# Patient Record
Sex: Male | Born: 1995 | Race: Black or African American | Hispanic: No | Marital: Single | Smoking: Never smoker
Health system: Southern US, Community
[De-identification: ages and names within clinical notes are randomized; demographics above are authoritative.]

---
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
## 2018-06-09 ENCOUNTER — Other Ambulatory Visit: Payer: Self-pay

## 2018-06-09 DIAGNOSIS — Z20822 Contact with and (suspected) exposure to covid-19: Secondary | ICD-10-CM

## 2018-06-11 LAB — NOVEL CORONAVIRUS, NAA: SARS-CoV-2, NAA: NOT DETECTED

## 2018-06-14 ENCOUNTER — Ambulatory Visit: Payer: Self-pay | Admitting: *Deleted

## 2018-06-14 ENCOUNTER — Telehealth: Payer: Self-pay | Admitting: *Deleted

## 2018-06-14 NOTE — Telephone Encounter (Signed)
Pt calling to request results be mailed, or picked up; see previous encounter. Pt given directions to sign up for MyChart. Verbalizes understaning.

## 2018-06-14 NOTE — Telephone Encounter (Signed)
See telephone encounter.

## 2018-06-14 NOTE — Telephone Encounter (Signed)
Pt had called for his test results for covid-19.  Returned the call to him and read to him the "not detected" on his covid-19 test. Pt voiced understanding and wanted to know if those results can be mailed to him at : [address] [place] [address]. Advised also to sign up for MyChart.

## 2019-03-15 ENCOUNTER — Ambulatory Visit: Payer: Medicaid Other | Attending: Family

## 2019-03-15 ENCOUNTER — Other Ambulatory Visit: Payer: Self-pay

## 2019-03-15 DIAGNOSIS — Z20822 Contact with and (suspected) exposure to covid-19: Secondary | ICD-10-CM

## 2019-03-16 LAB — NOVEL CORONAVIRUS, NAA: SARS-CoV-2, NAA: NOT DETECTED

## 2019-11-02 ENCOUNTER — Emergency Department (HOSPITAL_COMMUNITY): Payer: Self-pay

## 2019-11-02 ENCOUNTER — Other Ambulatory Visit: Payer: Self-pay

## 2019-11-02 ENCOUNTER — Emergency Department (HOSPITAL_COMMUNITY)
Admission: EM | Admit: 2019-11-02 | Discharge: 2019-11-02 | Disposition: A | Discharge status: Home / Self Care | Payer: Self-pay | Attending: Emergency Medicine | Admitting: Emergency Medicine

## 2019-11-02 ENCOUNTER — Encounter (HOSPITAL_COMMUNITY): Payer: Self-pay

## 2019-11-02 DIAGNOSIS — R109 Unspecified abdominal pain: Secondary | ICD-10-CM | POA: Insufficient documentation

## 2019-11-02 DIAGNOSIS — R42 Dizziness and giddiness: Secondary | ICD-10-CM | POA: Insufficient documentation

## 2019-11-02 DIAGNOSIS — R55 Syncope and collapse: Secondary | ICD-10-CM | POA: Insufficient documentation

## 2019-11-02 LAB — CBC
HCT: 42.2 % (ref 39.0–52.0)
Hemoglobin: 14.4 g/dL (ref 13.0–17.0)
MCH: 31.6 pg (ref 26.0–34.0)
MCHC: 34.1 g/dL (ref 30.0–36.0)
MCV: 92.5 fL (ref 80.0–100.0)
Platelets: 264 10*3/uL (ref 150–400)
RBC: 4.56 MIL/uL (ref 4.22–5.81)
RDW: 12.9 % (ref 11.5–15.5)
WBC: 5.4 10*3/uL (ref 4.0–10.5)
nRBC: 0 % (ref 0.0–0.2)

## 2019-11-02 LAB — CBG MONITORING, ED: Glucose-Capillary: 87 mg/dL (ref 70–99)

## 2019-11-02 LAB — BASIC METABOLIC PANEL
Anion gap: 9 (ref 5–15)
BUN: 13 mg/dL (ref 6–20)
CO2: 24 mmol/L (ref 22–32)
Calcium: 9.3 mg/dL (ref 8.9–10.3)
Chloride: 105 mmol/L (ref 98–111)
Creatinine, Ser: 0.9 mg/dL (ref 0.61–1.24)
GFR, Estimated: >60 mL/min (ref >60)
Glucose, Bld: 90 mg/dL (ref 70–99)
Potassium: 3.8 mmol/L (ref 3.5–5.1)
Sodium: 138 mmol/L (ref 135–145)

## 2019-11-02 MED ORDER — SODIUM CHLORIDE 0.9 % IV BOLUS
1000.0000 mL | Freq: Once | INTRAVENOUS | Status: AC
  Administered 2019-11-02: 1000 mL via INTRAVENOUS

## 2019-11-02 NOTE — Discharge Instructions (Addendum)
Drink lots of fluids.  Eat small, frequent meals.

## 2019-11-02 NOTE — ED Notes (Signed)
Pt transported to Xray. 

## 2019-11-02 NOTE — ED Triage Notes (Signed)
Pt to er, pt states that he is here because at work he started feeling dizzy and lightheaded, states that he has shoveling meat at work and started having some double vision.  States that he also has some back pain.

## 2019-11-02 NOTE — ED Provider Notes (Signed)
[facility] EMERGENCY DEPARTMENT Provider Note   CSN: [id number] Arrival date & time: 11/02/19  5093     History Chief Complaint  Patient presents with  . Weakness    Justin Huynh is a 24 y.o. male.  Pt presents to the ED today with weakness.  Pt said he woke up this morning and had some abdominal pain. Pt said the abdominal pain went away and he went to work.  While at work, he felt lightheaded and felt like he was going to pass out.  He sat down and did not feel much better.  He came in for eval.  He is feeling better now after waiting in the [facility] for several hrs.  He denies any n/v/d.  No f/c.  No trouble seeing or walking.        History reviewed. No pertinent past medical history.  There are no problems to display for this patient.   History reviewed. No pertinent surgical history.     History reviewed. No pertinent family history.  Social History   Tobacco Use  . Smoking status: Never Smoker  . Smokeless tobacco: Never Used  Substance Use Topics  . Alcohol use: No  . Drug use: No    Home Medications Prior to Admission medications   Not on File    Allergies    Patient has no known allergies.  Review of Systems   Review of Systems  Gastrointestinal: Positive for abdominal pain.  Neurological: Positive for light-headedness.  All other systems reviewed and are negative.   Physical Exam Updated Vital Signs BP 132/65   Pulse 65   Temp 98.1 F (36.7 C) (Oral)   Resp 18   Ht 5\' 11"  (1.803 m)   Wt 81.6 kg   SpO2 99%   BMI 25.10 kg/m   Physical Exam Vitals and nursing note reviewed.  Constitutional:      Appearance: Normal appearance.  HENT:     Head: Normocephalic and atraumatic.     Right Ear: External ear normal.     Left Ear: External ear normal.     Nose: Nose normal.     Mouth/Throat:     Mouth: Mucous membranes are moist.     Pharynx: Oropharynx is clear.  Eyes:     Extraocular Movements: Extraocular movements intact.      Conjunctiva/sclera: Conjunctivae normal.     Pupils: Pupils are equal, round, and reactive to light.  Cardiovascular:     Rate and Rhythm: Normal rate and regular rhythm.     Pulses: Normal pulses.     Heart sounds: Normal heart sounds.  Pulmonary:     Effort: Pulmonary effort is normal.     Breath sounds: Normal breath sounds.  Abdominal:     General: Abdomen is flat. Bowel sounds are normal.     Palpations: Abdomen is soft.  Musculoskeletal:        General: Normal range of motion.     Cervical back: Normal range of motion and neck supple.  Skin:    General: Skin is warm.     Capillary Refill: Capillary refill takes less than 2 seconds.  Neurological:     General: No focal deficit present.     Mental Status: He is alert and oriented to person, place, and time.  Psychiatric:        Mood and Affect: Mood normal.        Behavior: Behavior normal.     ED Results / Procedures /  Treatments   Labs (all labs ordered are listed, but only abnormal results are displayed) Labs Reviewed  BASIC METABOLIC PANEL  CBC  URINALYSIS, ROUTINE W REFLEX MICROSCOPIC  CBG MONITORING, ED    EKG EKG Interpretation  Date/Time:  Wednesday November 02 2019 16:29:08 EDT Ventricular Rate:  61 PR Interval:  150 QRS Duration: 86 QT Interval:  380 QTC Calculation: 382 R Axis:   84 Text Interpretation: Normal sinus rhythm ST elevation, consider early repolarization, pericarditis, or injury Nonspecific ST abnormality Abnormal ECG Confirmed by Jacalyn Lefevre [id number]) on 11/02/2019 9:16:13 PM   Radiology DG Chest 2 View  Result Date: 11/02/2019 CLINICAL DATA:  Pain EXAM: CHEST - 2 VIEW COMPARISON:  None. FINDINGS: The heart size and mediastinal contours are within normal limits. Both lungs are clear. The visualized skeletal structures are unremarkable. IMPRESSION: No active cardiopulmonary disease. Electronically Signed   By: Katherine Mantle M.D.   On: 11/02/2019 22:01    Procedures Procedures  (including critical care time)  Medications Ordered in ED Medications  sodium chloride 0.9 % bolus 1,000 mL (0 mLs Intravenous Stopped 11/02/19 2224)    ED Course  I have reviewed the triage vital signs and the nursing notes.  Pertinent labs & imaging results that were available during my care of the patient were reviewed by me and considered in my medical decision making (see chart for details).    MDM Rules/Calculators/A&P                          Blood work is normal.  CXR nl.  EKG with early repol.  Pt does not want any more fluids.  He does not want to give a urine sample.  He said he is ready to go.  Pt looks good.  He is stable for d/c.  Return if worse.  Final Clinical Impression(s) / ED Diagnoses Final diagnoses:  Near syncope    Rx / DC Orders ED Discharge Orders    None       Jacalyn Lefevre, MD 11/02/19 2227

## 2020-05-02 ENCOUNTER — Emergency Department (HOSPITAL_COMMUNITY): Payer: Self-pay

## 2020-05-02 ENCOUNTER — Other Ambulatory Visit: Payer: Self-pay

## 2020-05-02 ENCOUNTER — Encounter (HOSPITAL_COMMUNITY): Payer: Self-pay | Admitting: *Deleted

## 2020-05-02 ENCOUNTER — Emergency Department (HOSPITAL_COMMUNITY)
Admission: EM | Admit: 2020-05-02 | Discharge: 2020-05-02 | Disposition: A | Discharge status: Home / Self Care | Payer: Self-pay | Attending: Emergency Medicine | Admitting: Emergency Medicine

## 2020-05-02 DIAGNOSIS — J101 Influenza due to other identified influenza virus with other respiratory manifestations: Secondary | ICD-10-CM | POA: Insufficient documentation

## 2020-05-02 DIAGNOSIS — R112 Nausea with vomiting, unspecified: Secondary | ICD-10-CM | POA: Insufficient documentation

## 2020-05-02 DIAGNOSIS — Z20822 Contact with and (suspected) exposure to covid-19: Secondary | ICD-10-CM | POA: Insufficient documentation

## 2020-05-02 DIAGNOSIS — R000 Tachycardia, unspecified: Secondary | ICD-10-CM | POA: Insufficient documentation

## 2020-05-02 LAB — RESP PANEL BY RT-PCR (FLU A&B, COVID) ARPGX2
Influenza A by PCR: POSITIVE — AB
Influenza B by PCR: NEGATIVE
SARS Coronavirus 2 by RT PCR: NEGATIVE

## 2020-05-02 MED ORDER — ALBUTEROL SULFATE HFA 108 (90 BASE) MCG/ACT IN AERS
2.0000 | INHALATION_SPRAY | Freq: Once | RESPIRATORY_TRACT | Status: AC
  Administered 2020-05-02: 2 via RESPIRATORY_TRACT
  Filled 2020-05-02: qty 6.7

## 2020-05-02 MED ORDER — ONDANSETRON 4 MG PO TBDP
4.0000 mg | ORAL_TABLET | Freq: Once | ORAL | Status: AC
  Administered 2020-05-02: 4 mg via ORAL
  Filled 2020-05-02: qty 1

## 2020-05-02 NOTE — ED Triage Notes (Signed)
Pt chest tightness to upper chest across since yesterday, +cough-yellow phlegm

## 2020-05-02 NOTE — ED Provider Notes (Signed)
[facility] EMERGENCY DEPARTMENT Provider Note   CSN: [id number] Arrival date & time: 05/02/20  1520     History Chief Complaint  Patient presents with  . Chest Pain    Justin Huynh is a 25 y.o. male.  HPI 25 year old male with no significant medical his presents to the ER with complaints of chest tightness, body aches, cough, nasal congestion which began yesterday.  Patient reports significant chills and overall feeling poorly over the last 24 hours.  He has some chest tightness when he coughs, but no shortness of breath or chest pain.  No pleuritic symptoms.  No leg swelling.  No recent travel.  He is not vaccinated for the flu, reports that he has had 1 shot of the COVID immunization but not the second.  No other sick contacts. He had 1 episode of nonbloody 9 he had 1 episode of nonbloody nonbilious emesis yesterday, but none since    History reviewed. No pertinent past medical history.  There are no problems to display for this patient.   History reviewed. No pertinent surgical history.     History reviewed. No pertinent family history.  Social History   Tobacco Use  . Smoking status: Never Smoker  . Smokeless tobacco: Never Used  Substance Use Topics  . Alcohol use: No  . Drug use: No    Home Medications Prior to Admission medications   Not on File    Allergies    Patient has no known allergies.  Review of Systems   Review of Systems  Constitutional: Positive for fatigue. Negative for chills and fever.  HENT: Negative for ear pain and sore throat.   Eyes: Negative for pain and visual disturbance.  Respiratory: Positive for cough and chest tightness. Negative for shortness of breath.   Cardiovascular: Negative for chest pain and palpitations.  Gastrointestinal: Positive for nausea and vomiting. Negative for abdominal pain.  Genitourinary: Negative for dysuria and hematuria.  Musculoskeletal: Positive for myalgias. Negative for arthralgias and back pain.   Skin: Negative for color change and rash.  Neurological: Negative for seizures and syncope.  All other systems reviewed and are negative.   Physical Exam Updated Vital Signs BP (!) 152/81   Pulse (!) 109   Temp 98.6 F (37 C) (Oral)   Resp 20   Ht 5\' 11"  (1.803 m)   Wt 83.9 kg   SpO2 100%   BMI 25.80 kg/m   Physical Exam Vitals and nursing note reviewed.  Constitutional:      General: He is not in acute distress.    Appearance: He is well-developed. He is not ill-appearing, toxic-appearing or diaphoretic.  HENT:     Head: Normocephalic and atraumatic.  Eyes:     Conjunctiva/sclera: Conjunctivae normal.  Cardiovascular:     Rate and Rhythm: Normal rate and regular rhythm.     Heart sounds: No murmur heard.   Pulmonary:     Effort: Pulmonary effort is normal. No respiratory distress.     Breath sounds: Normal breath sounds. No decreased breath sounds, wheezing, rhonchi or rales.  Chest:     Chest wall: No tenderness.  Abdominal:     Palpations: Abdomen is soft.     Tenderness: There is no abdominal tenderness.  Musculoskeletal:     Cervical back: Neck supple.     Right lower leg: No edema.     Left lower leg: No edema.  Skin:    General: Skin is warm and dry.  Neurological:  General: No focal deficit present.     Mental Status: He is alert.     Cranial Nerves: No cranial nerve deficit.  Psychiatric:        Mood and Affect: Mood is not anxious.        Behavior: Behavior is not agitated.     ED Results / Procedures / Treatments   Labs (all labs ordered are listed, but only abnormal results are displayed) Labs Reviewed  RESP PANEL BY RT-PCR (FLU A&B, COVID) ARPGX2 - Abnormal; Notable for the following components:      Result Value   Influenza A by PCR POSITIVE (*)    All other components within normal limits    EKG EKG Interpretation  Date/Time:  Wednesday May 02 2020 15:28:35 EDT Ventricular Rate:  98 PR Interval:  152 QRS Duration: 82 QT  Interval:  332 QTC Calculation: 423 R Axis:   85 Text Interpretation: Normal sinus rhythm Biatrial enlargement Abnormal ECG Since last tracing Right atrial hypertrophy is new Otherwise no significant change Confirmed by Mancel Bale [id number]) on 05/02/2020 3:44:22 PM   Radiology DG Chest Portable 1 View  Result Date: 05/02/2020 CLINICAL DATA:  Cough, chest tightness to upper chest across the upper chest since yesterday. COVID test pending by report. EXAM: PORTABLE CHEST 1 VIEW COMPARISON:  11/02/2019 FINDINGS: Trachea midline. Cardiomediastinal contours and hilar structures are normal. Lungs are clear. No sign of pleural effusion. EKG leads project over the chest. On limited assessment no acute skeletal process. IMPRESSION: No acute cardiopulmonary disease. Electronically Signed   By: Donzetta Kohut M.D.   On: 05/02/2020 16:36    Procedures Procedures   Medications Ordered in ED Medications  albuterol (VENTOLIN HFA) 108 (90 Base) MCG/ACT inhaler 2 puff (2 puffs Inhalation Given 05/02/20 1631)  ondansetron (ZOFRAN-ODT) disintegrating tablet 4 mg (4 mg Oral Given 05/02/20 1630)    ED Course  I have reviewed the triage vital signs and the nursing notes.  Pertinent labs & imaging results that were available during my care of the patient were reviewed by me and considered in my medical decision making (see chart for details).    MDM Rules/Calculators/A&P                         25 year old male with URI type symptoms.  On arrival, he is well-appearing, in no acute distress, resting comfortably in the ER bed.  Initially blood pressure, heart rate, O2 sats reassuring, afebrile.  Patient is visibly anxious on my exam, and progressively became more tachycardic hypertensive throughout his stay.  Nursing staff reports that he was on the phone with someone arguing throughout his ED course.  His physical exam overall is unremarkable, lung sounds clear, no evidence of respiratory distress.  His chest  x-ray is unremarkable, EKG with new right atrial enlargement but otherwise no significant findings.  COVID test is negative, flu test is positive.  Patient increasingly anxious, requesting to leave.  Originally basic labs ordered, D-dimer as PE is on my differential given tachycardia, however patient continued to express desire to leave.  I do think that his tachycardia is secondary due to anxiety.  We discussed the risk of leaving without completing a work-up, however patient feels as though his symptoms are secondary to the flu and he just wants to rest in bed.  Again he has no wheezing, and I do think that his tachycardia is secondary to to anxiety versus PE.  He has no hemoptysis  or other risk factors to predispose him to a PE.  We did discuss return precautions, he voiced understanding and is agreeable.  Stable for discharge.  Case discussed with Dr. Effie Shy was agreeable to the plan with discharge  Final Clinical Impression(s) / ED Diagnoses Final diagnoses:  Influenza A    Rx / DC Orders ED Discharge Orders    None       Mare Ferrari, PA-C 05/02/20 1757    Mancel Bale, MD 05/03/20 1404

## 2020-05-02 NOTE — ED Notes (Signed)
Lab Tech at bedside.

## 2020-05-02 NOTE — Discharge Instructions (Addendum)
You tested positive for the flu today.  As discussed, without completing your blood work there is a risk of missing a possible blood clot however I think your symptoms are due to the Flu and my suspicion for a life threatening cause of your symptoms is low. Please take Tylenol or ibuprofen for body aches, drink plenty of fluids, you may take over-the-counter cold and flu medicines.  Please quarantine from other people.  Please return to the ER for any new or worsening symptoms including worsening shortness of breath, coughing up blood, etc

## 2021-07-10 IMAGING — DX DG CHEST 2V
2 series · 2 of 2 positions shown · non-contrast
Comparison: None.

CLINICAL DATA: Pain

EXAM:
CHEST - 2 VIEW

[chest pa]
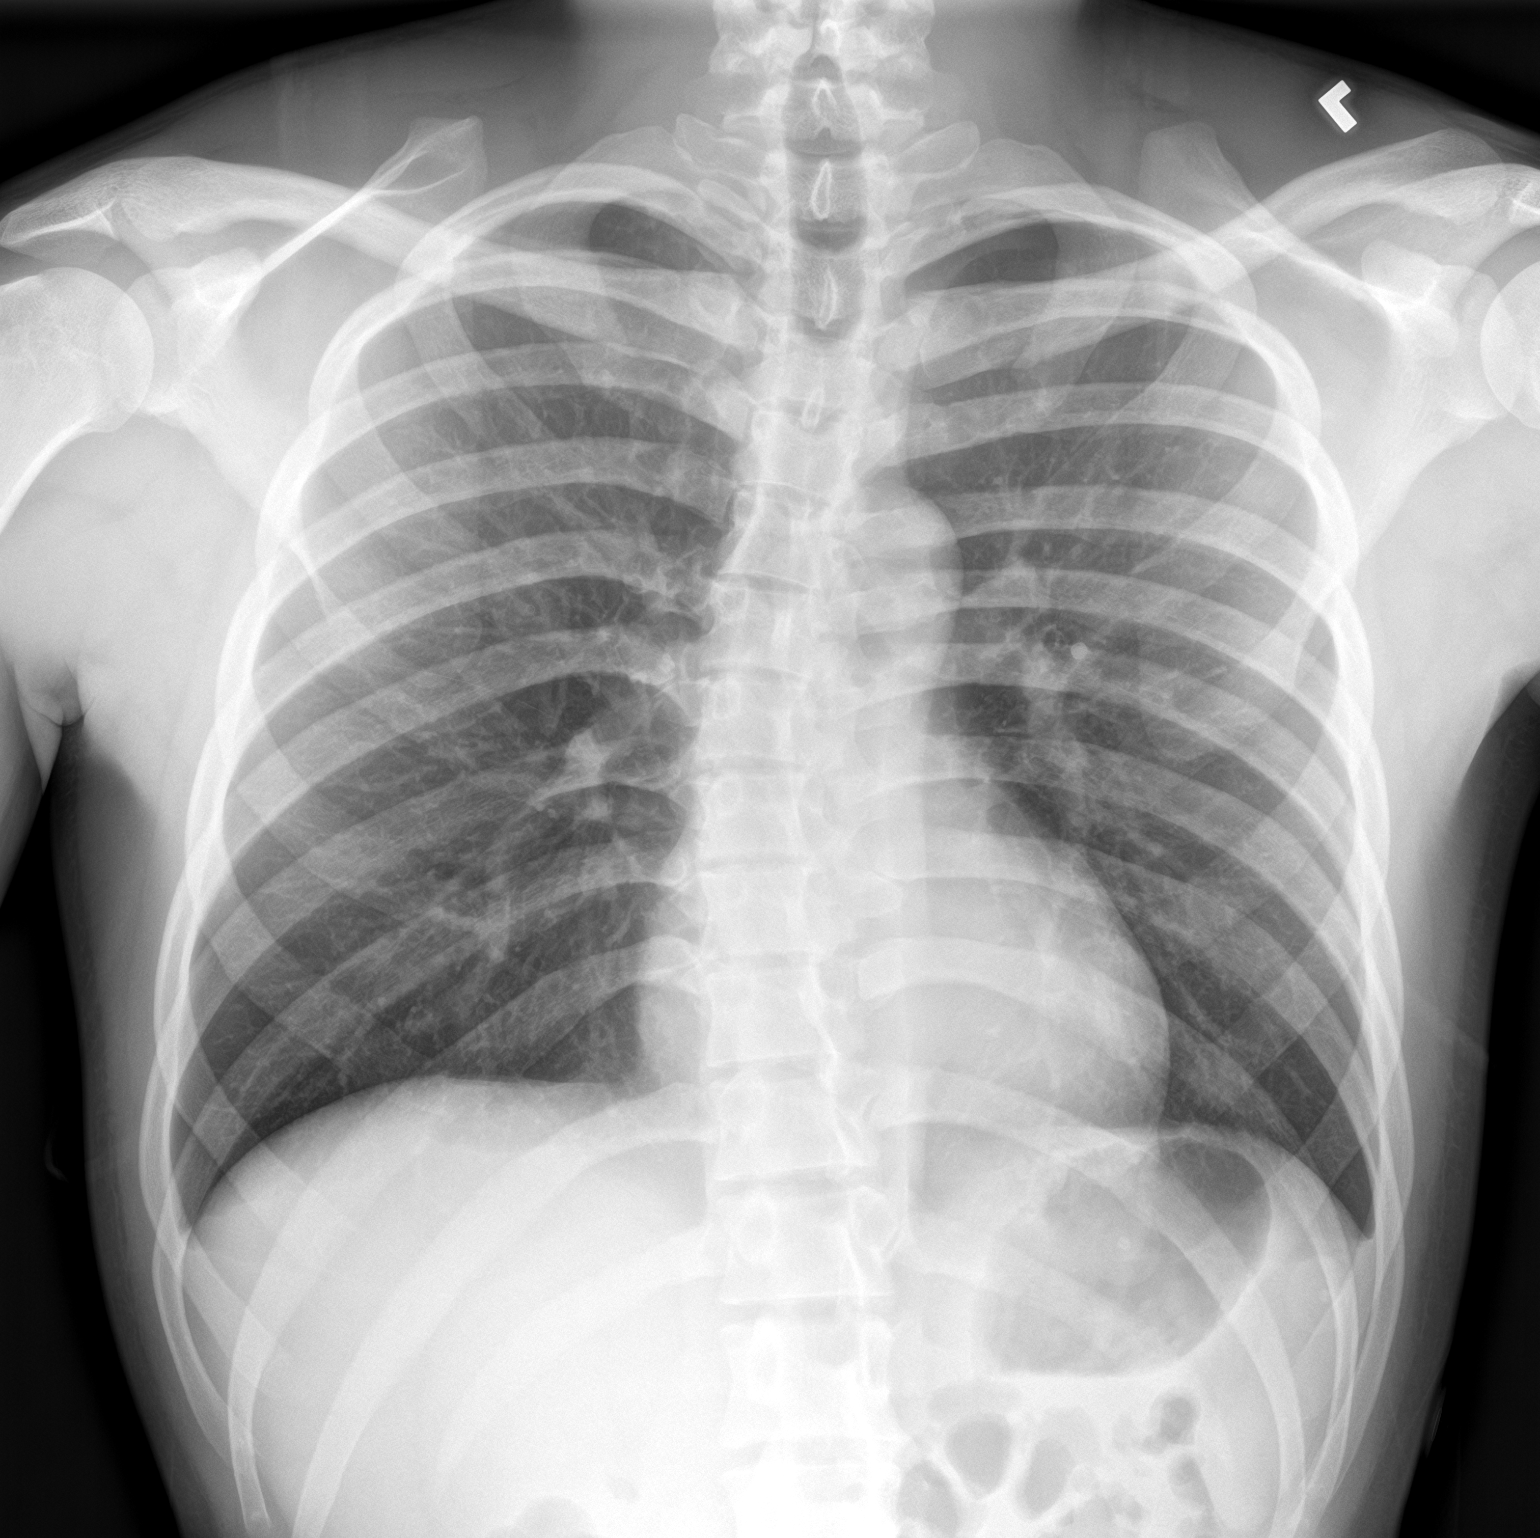

[chest lat]
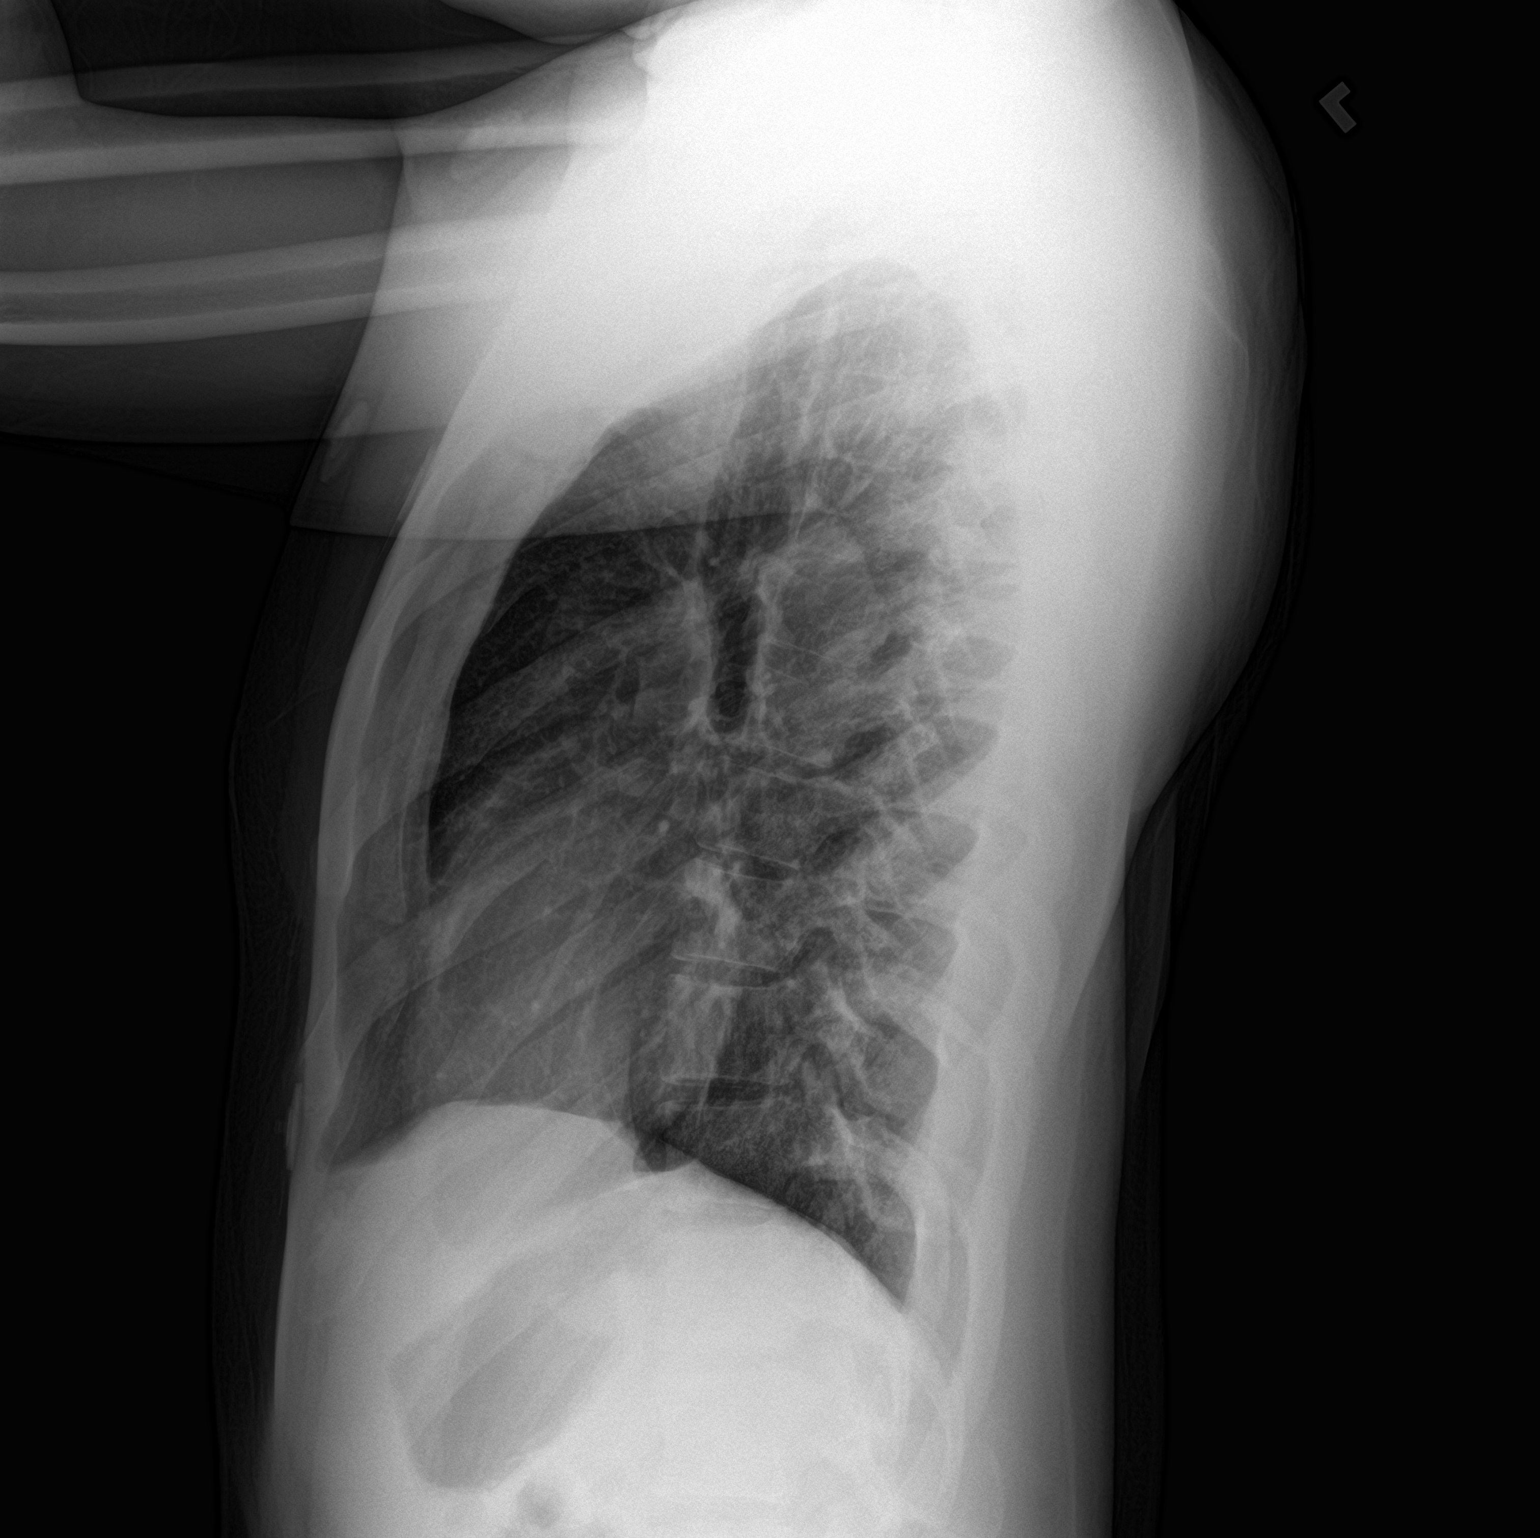

[2 of 2 positions shown; findings below may reference images not displayed]

FINDINGS: The heart size and mediastinal contours are within normal limits.
Both lungs are clear. The visualized skeletal structures are
unremarkable.
IMPRESSION: No active cardiopulmonary disease.

## 2022-01-08 IMAGING — DX DG CHEST 1V PORT
1 series · 1 of 1 positions shown · non-contrast
Comparison: 11/02/2019

CLINICAL DATA: Cough, chest tightness to upper chest across the
upper chest since yesterday. COVID test pending by report.

EXAM:
PORTABLE CHEST 1 VIEW

[chest ap]
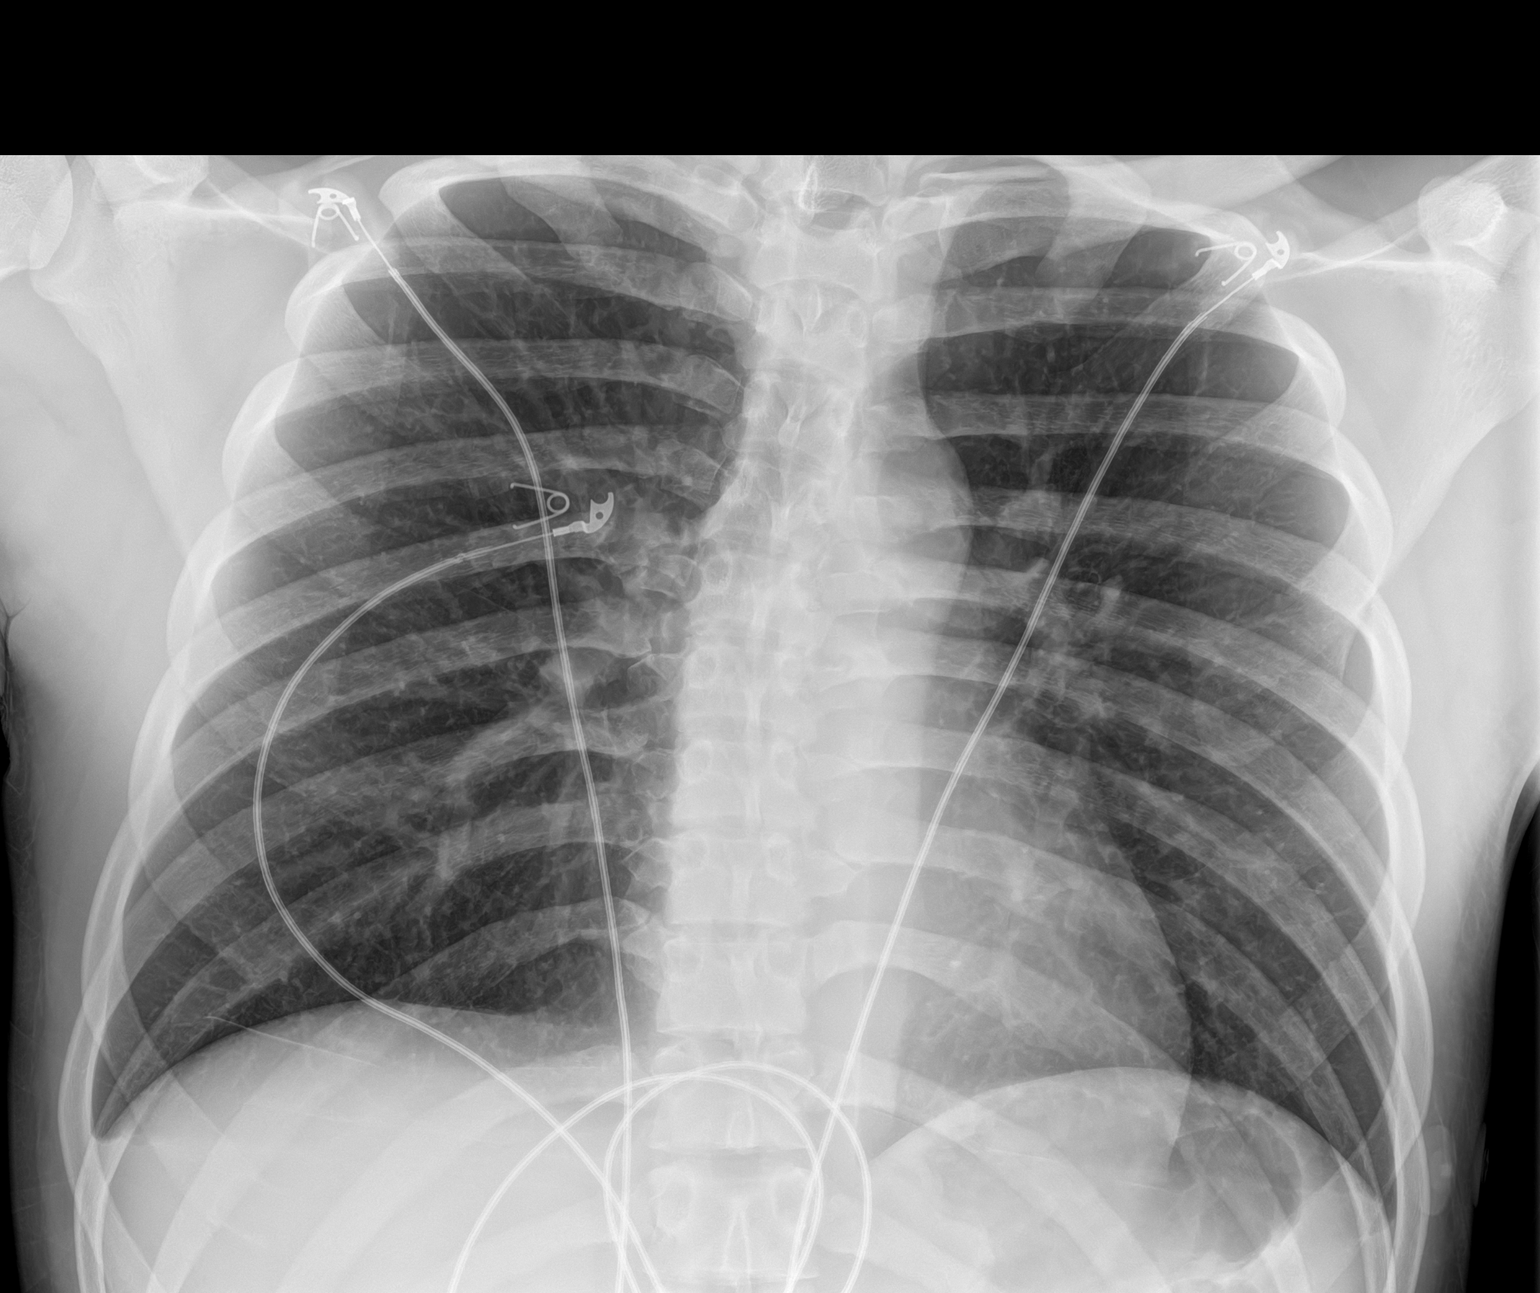

[1 of 1 positions shown; findings below may reference images not displayed]

FINDINGS: Trachea midline. Cardiomediastinal contours and hilar structures are
normal.

Lungs are clear. No sign of pleural effusion. EKG leads project over
the chest.

On limited assessment no acute skeletal process.
IMPRESSION: No acute cardiopulmonary disease.
